# Patient Record
Sex: Male | Born: 1980 | Smoking: Current every day smoker
Health system: Southern US, Community
[De-identification: ages and names within clinical notes are randomized; demographics above are authoritative.]

---

## 2017-04-10 ENCOUNTER — Emergency Department (HOSPITAL_COMMUNITY)
Admission: EM | Admit: 2017-04-10 | Discharge: 2017-04-10 | Disposition: A | Discharge status: Home / Self Care | Payer: Self-pay | Attending: Emergency Medicine | Admitting: Emergency Medicine

## 2017-04-10 ENCOUNTER — Emergency Department (HOSPITAL_COMMUNITY): Payer: Self-pay

## 2017-04-10 ENCOUNTER — Encounter (HOSPITAL_COMMUNITY): Payer: Self-pay | Admitting: Emergency Medicine

## 2017-04-10 DIAGNOSIS — Y9289 Other specified places as the place of occurrence of the external cause: Secondary | ICD-10-CM | POA: Insufficient documentation

## 2017-04-10 DIAGNOSIS — F17200 Nicotine dependence, unspecified, uncomplicated: Secondary | ICD-10-CM | POA: Insufficient documentation

## 2017-04-10 DIAGNOSIS — Y939 Activity, unspecified: Secondary | ICD-10-CM | POA: Insufficient documentation

## 2017-04-10 DIAGNOSIS — S0181XA Laceration without foreign body of other part of head, initial encounter: Secondary | ICD-10-CM | POA: Insufficient documentation

## 2017-04-10 DIAGNOSIS — Y999 Unspecified external cause status: Secondary | ICD-10-CM | POA: Insufficient documentation

## 2017-04-10 DIAGNOSIS — S0990XA Unspecified injury of head, initial encounter: Secondary | ICD-10-CM | POA: Insufficient documentation

## 2017-04-10 DIAGNOSIS — S51011A Laceration without foreign body of right elbow, initial encounter: Secondary | ICD-10-CM | POA: Insufficient documentation

## 2017-04-10 MED ORDER — TETANUS-DIPHTH-ACELL PERTUSSIS 5-2.5-18.5 LF-MCG/0.5 IM SUSP
0.5000 mL | Freq: Once | INTRAMUSCULAR | Status: AC
  Administered 2017-04-10: 0.5 mL via INTRAMUSCULAR
  Filled 2017-04-10: qty 0.5

## 2017-04-10 MED ORDER — LIDOCAINE-EPINEPHRINE (PF) 2 %-1:200000 IJ SOLN
20.0000 mL | Freq: Once | INTRAMUSCULAR | Status: AC
  Administered 2017-04-10: 20 mL via INTRADERMAL
  Filled 2017-04-10: qty 20

## 2017-04-10 MED ORDER — ACETAMINOPHEN 500 MG PO TABS
1000.0000 mg | ORAL_TABLET | Freq: Once | ORAL | Status: AC
  Administered 2017-04-10: 1000 mg via ORAL
  Filled 2017-04-10: qty 2

## 2017-04-10 MED ORDER — LIDOCAINE HCL 2 % IJ SOLN
20.0000 mL | Freq: Once | INTRAMUSCULAR | Status: AC
  Administered 2017-04-10: 400 mg via INTRADERMAL
  Filled 2017-04-10: qty 20

## 2017-04-10 MED ORDER — IBUPROFEN 800 MG PO TABS
800.0000 mg | ORAL_TABLET | Freq: Once | ORAL | Status: AC
  Administered 2017-04-10: 800 mg via ORAL
  Filled 2017-04-10: qty 1

## 2017-04-10 NOTE — ED Notes (Signed)
Patient transported to CT 
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
## 2017-04-10 NOTE — ED Notes (Signed)
Patient left prior to receiving discharge instructions or signing for discharge.
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
